# Patient Record
Sex: Male | Born: 1995 | Race: White | Hispanic: No | Marital: Single
Health system: Southern US, Community
[De-identification: ages and names within clinical notes are randomized; demographics above are authoritative.]

---
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
## 2014-02-19 ENCOUNTER — Ambulatory Visit
Admission: RE | Admit: 2014-02-19 | Discharge: 2014-02-19 | Disposition: A | Discharge status: Home / Self Care | Payer: BC Managed Care – PPO | Source: Ambulatory Visit | Attending: Family Medicine | Admitting: Family Medicine

## 2014-02-19 ENCOUNTER — Other Ambulatory Visit: Payer: Self-pay | Admitting: Family Medicine

## 2014-02-19 DIAGNOSIS — M25571 Pain in right ankle and joints of right foot: Secondary | ICD-10-CM

## 2015-09-09 IMAGING — CR DG ANKLE COMPLETE 3+V*R*
3 series · 3 of 3 positions shown · non-contrast
Comparison: None.

CLINICAL DATA: Twisted right ankle.  Basketball injury 1 week ago.

EXAM:
RIGHT ANKLE - COMPLETE 3+ VIEW

[view not recorded (1 of 3)]
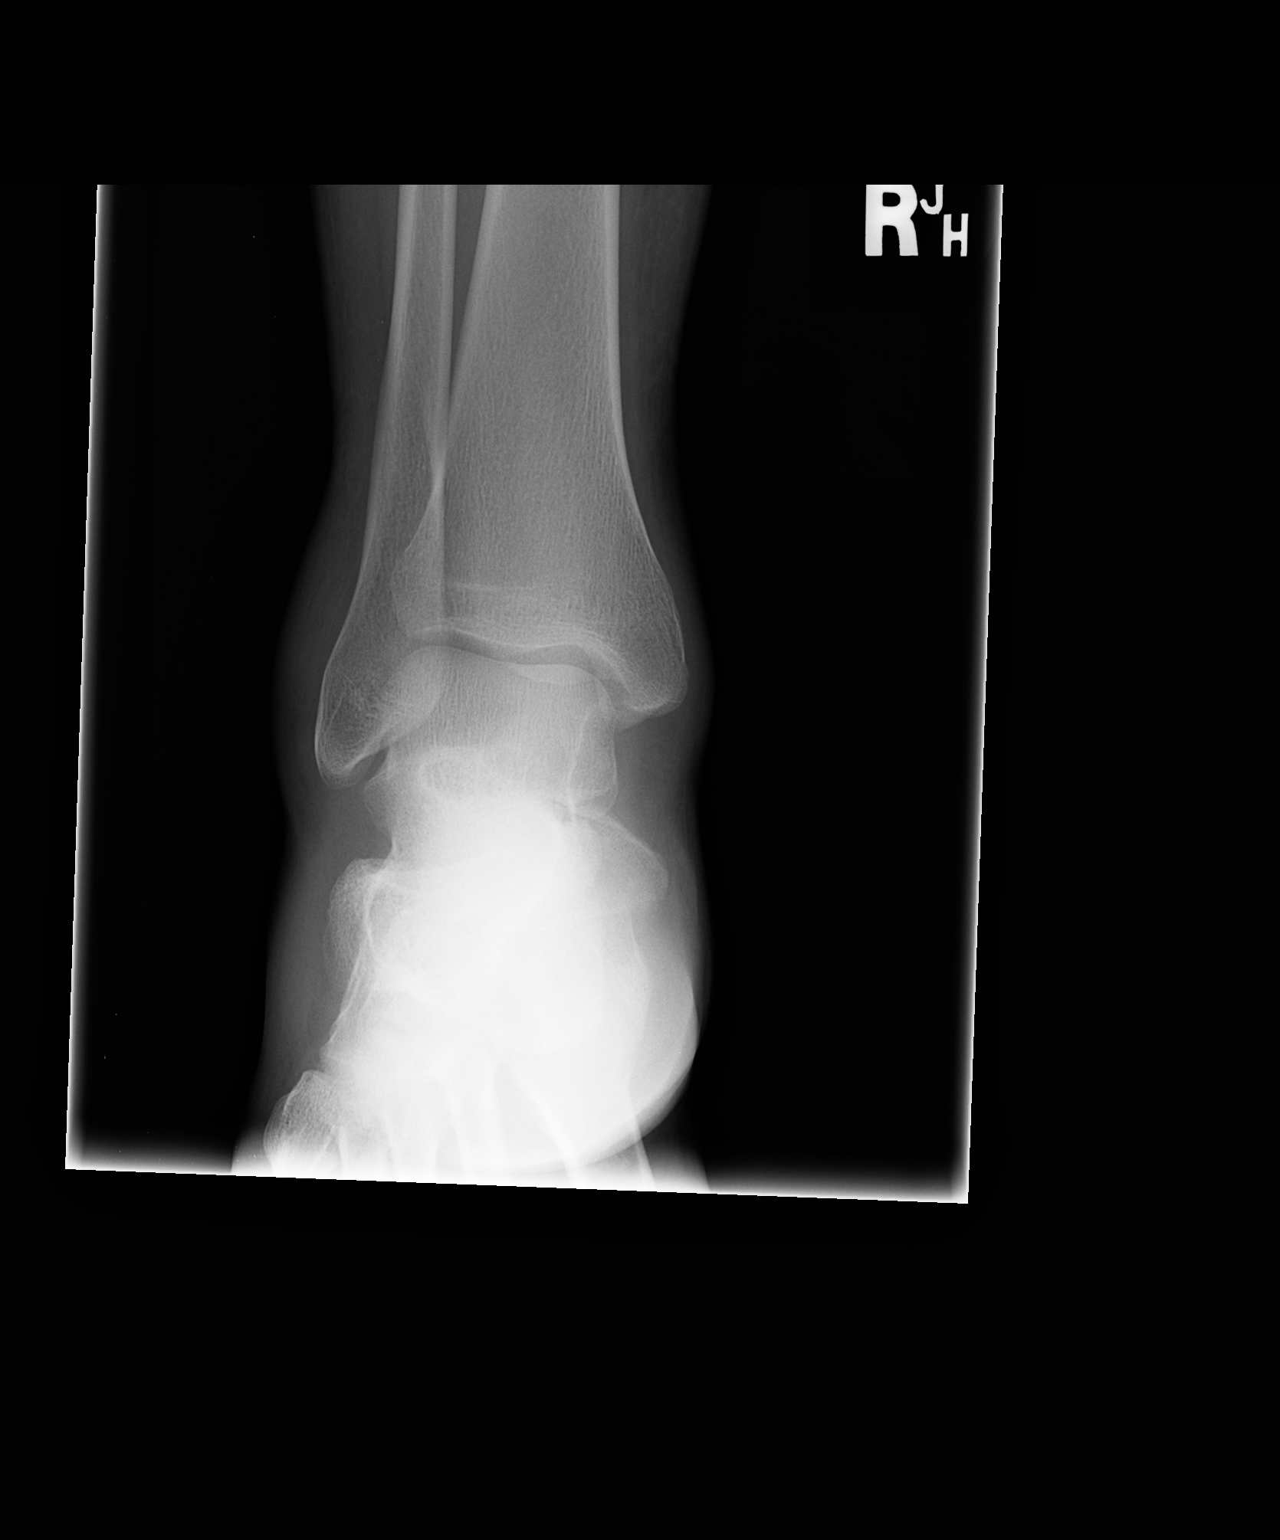

[view not recorded (2 of 3)]
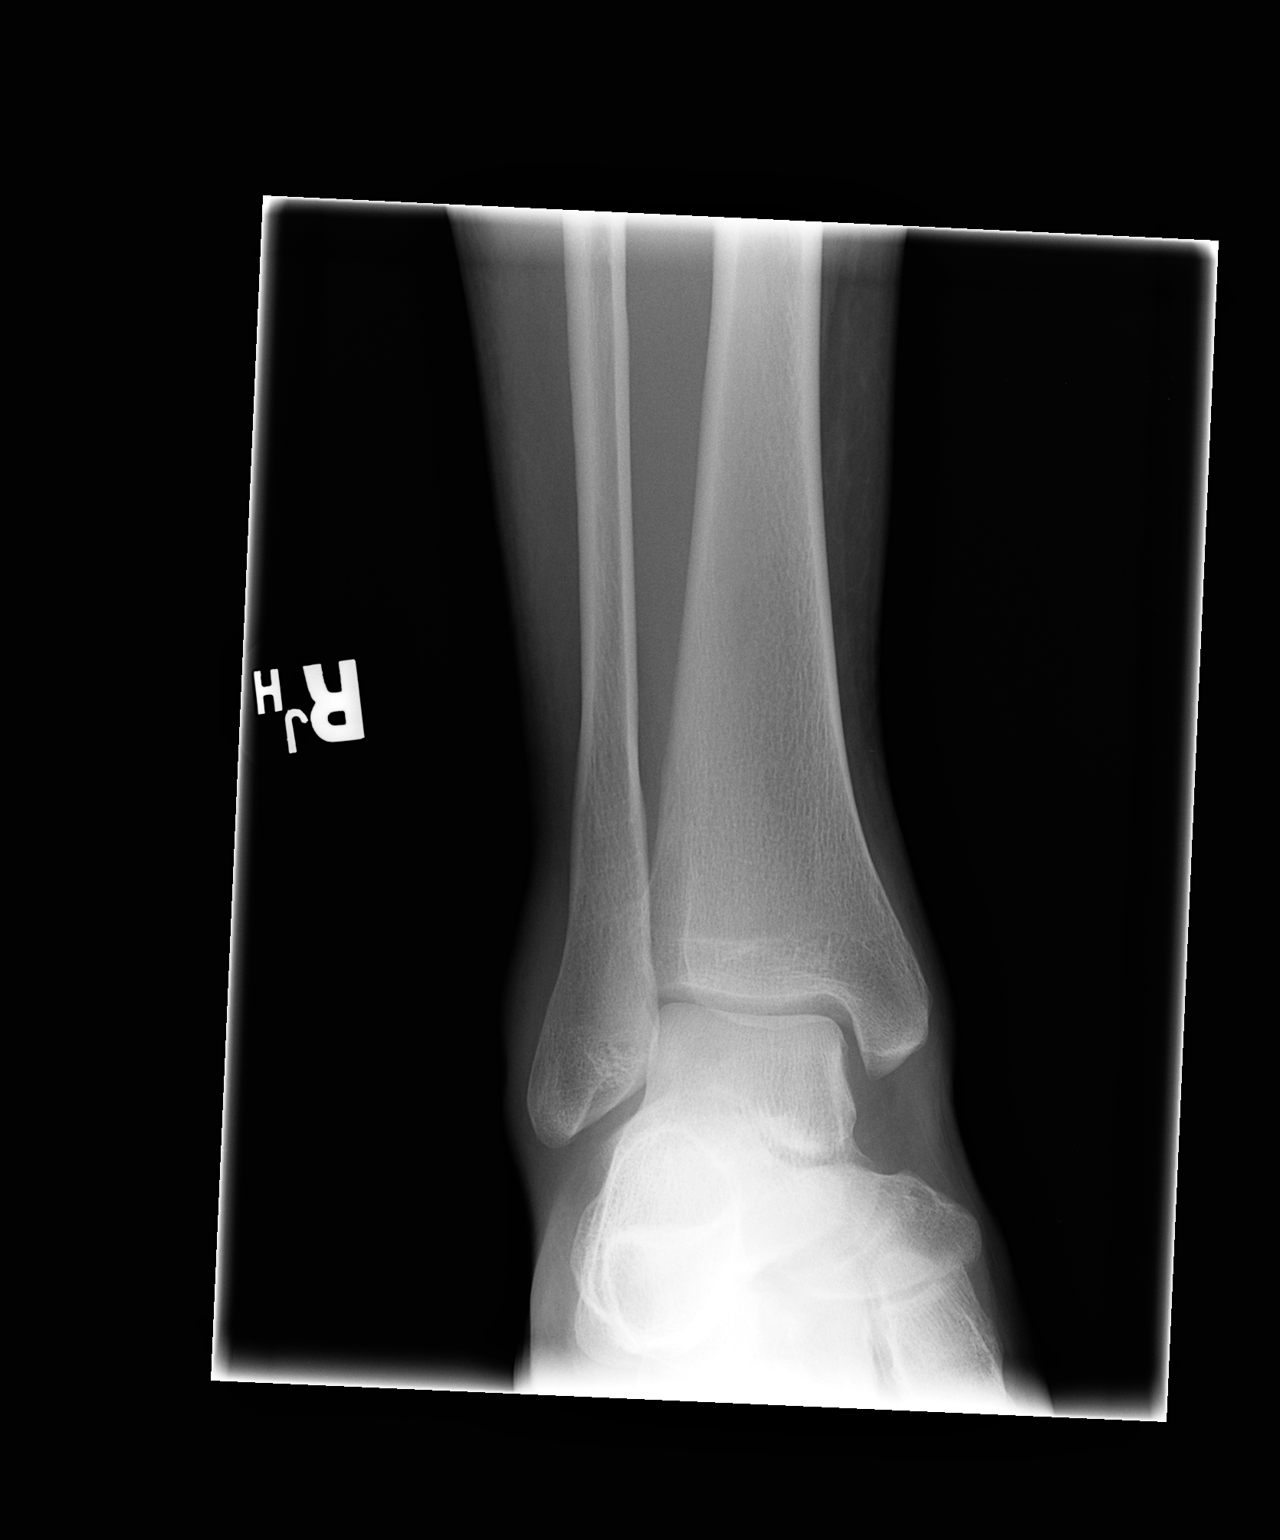

[view not recorded (3 of 3)]
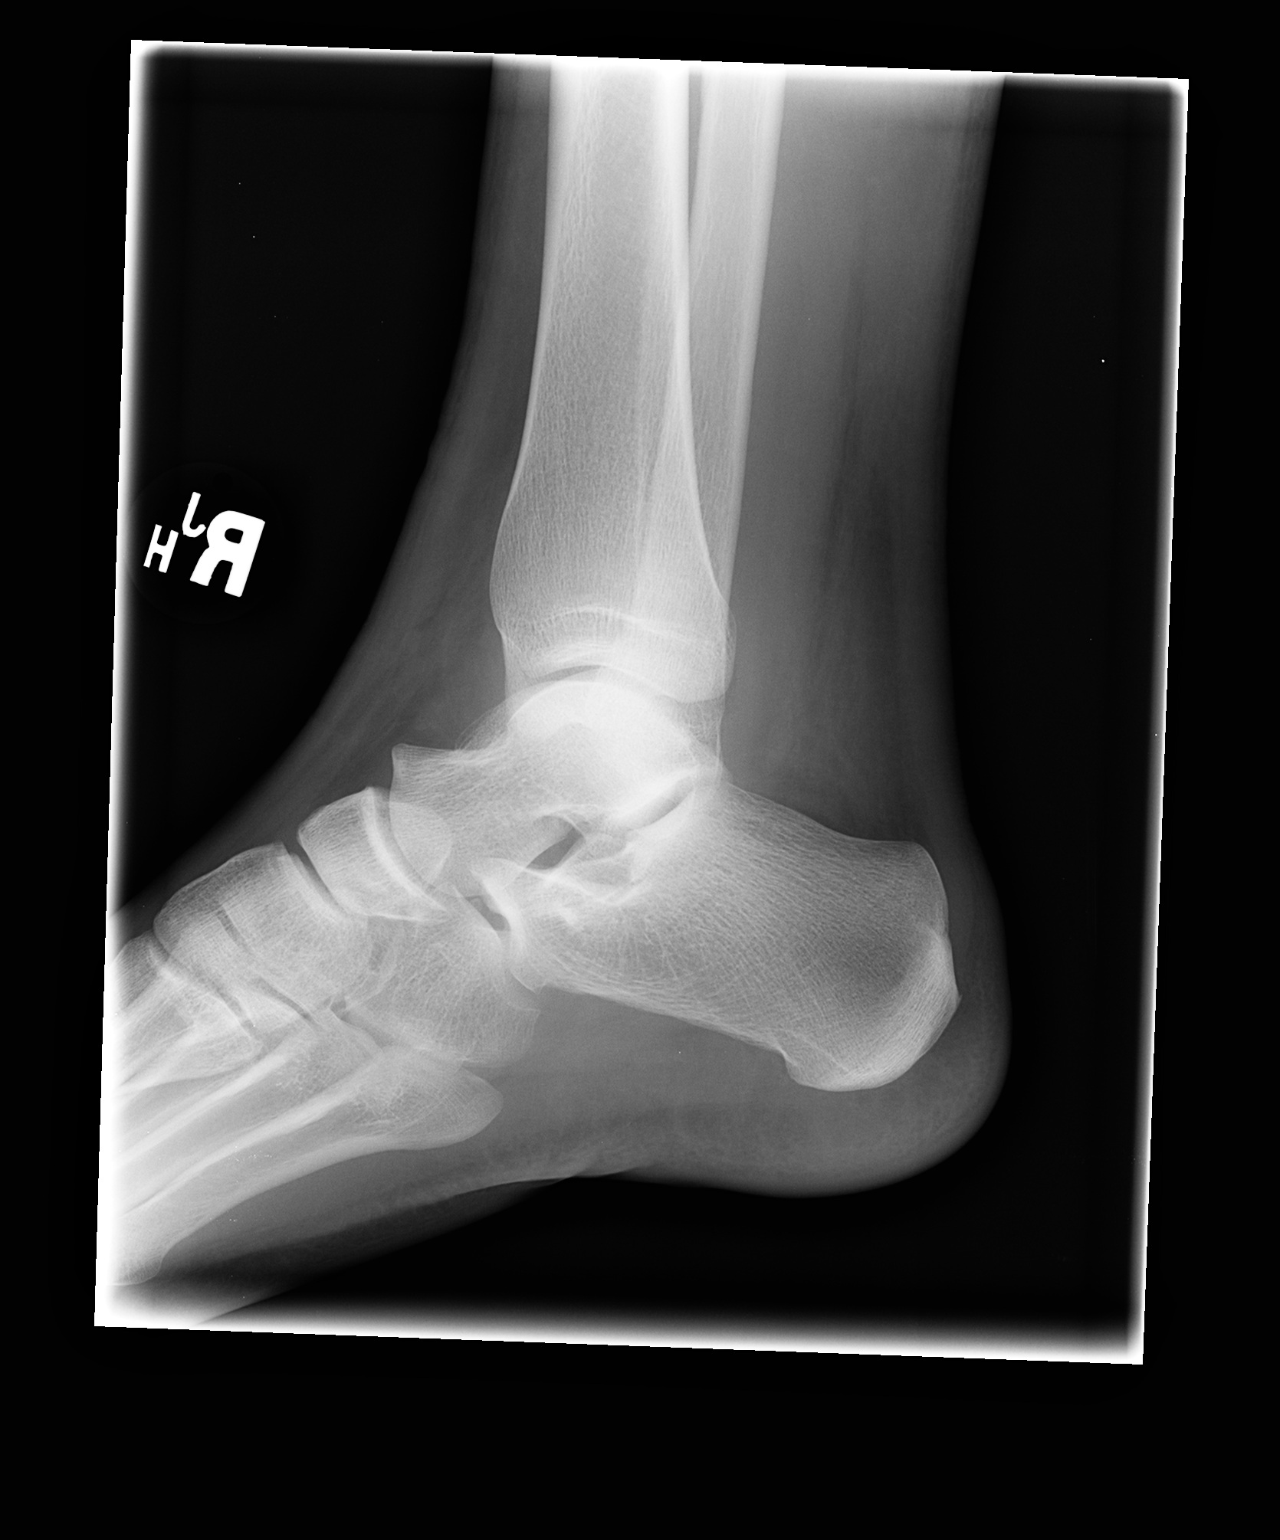

[3 of 3 positions shown; findings below may reference images not displayed]

FINDINGS: Diffuse soft tissue swelling present. No evidence of fracture or
dislocation. No acute bony abnormality.
IMPRESSION: Diffuse soft tissue swelling. No acute abnormality identified. No
evidence of fracture or dislocation.

## 2017-11-22 DIAGNOSIS — H1045 Other chronic allergic conjunctivitis: Secondary | ICD-10-CM | POA: Diagnosis not present

## 2018-08-22 DIAGNOSIS — J301 Allergic rhinitis due to pollen: Secondary | ICD-10-CM | POA: Diagnosis not present

## 2018-08-22 DIAGNOSIS — R319 Hematuria, unspecified: Secondary | ICD-10-CM | POA: Diagnosis not present

## 2018-08-28 DIAGNOSIS — R319 Hematuria, unspecified: Secondary | ICD-10-CM | POA: Diagnosis not present

## 2018-11-25 DIAGNOSIS — H1045 Other chronic allergic conjunctivitis: Secondary | ICD-10-CM | POA: Diagnosis not present

## 2019-09-15 DIAGNOSIS — Z0000 Encounter for general adult medical examination without abnormal findings: Secondary | ICD-10-CM | POA: Diagnosis not present

## 2019-12-29 DIAGNOSIS — H53143 Visual discomfort, bilateral: Secondary | ICD-10-CM | POA: Diagnosis not present

## 2019-12-29 DIAGNOSIS — H5213 Myopia, bilateral: Secondary | ICD-10-CM | POA: Diagnosis not present

## 2020-05-12 DIAGNOSIS — U071 COVID-19: Secondary | ICD-10-CM | POA: Diagnosis not present

## 2020-05-12 DIAGNOSIS — J029 Acute pharyngitis, unspecified: Secondary | ICD-10-CM | POA: Diagnosis not present

## 2020-05-12 DIAGNOSIS — R059 Cough, unspecified: Secondary | ICD-10-CM | POA: Diagnosis not present

## 2021-07-19 DIAGNOSIS — D224 Melanocytic nevi of scalp and neck: Secondary | ICD-10-CM | POA: Diagnosis not present

## 2021-07-19 DIAGNOSIS — D2261 Melanocytic nevi of right upper limb, including shoulder: Secondary | ICD-10-CM | POA: Diagnosis not present

## 2021-07-19 DIAGNOSIS — L308 Other specified dermatitis: Secondary | ICD-10-CM | POA: Diagnosis not present

## 2021-07-19 DIAGNOSIS — D225 Melanocytic nevi of trunk: Secondary | ICD-10-CM | POA: Diagnosis not present

## 2021-08-09 DIAGNOSIS — Z1159 Encounter for screening for other viral diseases: Secondary | ICD-10-CM | POA: Diagnosis not present

## 2021-08-09 DIAGNOSIS — Z0000 Encounter for general adult medical examination without abnormal findings: Secondary | ICD-10-CM | POA: Diagnosis not present

## 2021-08-09 DIAGNOSIS — Z13220 Encounter for screening for lipoid disorders: Secondary | ICD-10-CM | POA: Diagnosis not present

## 2022-01-11 DIAGNOSIS — L290 Pruritus ani: Secondary | ICD-10-CM | POA: Diagnosis not present

## 2022-08-31 DIAGNOSIS — Z0000 Encounter for general adult medical examination without abnormal findings: Secondary | ICD-10-CM | POA: Diagnosis not present

## 2022-08-31 DIAGNOSIS — H00015 Hordeolum externum left lower eyelid: Secondary | ICD-10-CM | POA: Diagnosis not present

## 2022-08-31 DIAGNOSIS — R17 Unspecified jaundice: Secondary | ICD-10-CM | POA: Diagnosis not present

## 2023-02-13 DIAGNOSIS — H53143 Visual discomfort, bilateral: Secondary | ICD-10-CM | POA: Diagnosis not present

## 2023-09-25 DIAGNOSIS — Z13220 Encounter for screening for lipoid disorders: Secondary | ICD-10-CM | POA: Diagnosis not present

## 2023-09-25 DIAGNOSIS — D229 Melanocytic nevi, unspecified: Secondary | ICD-10-CM | POA: Diagnosis not present

## 2023-09-25 DIAGNOSIS — Z0000 Encounter for general adult medical examination without abnormal findings: Secondary | ICD-10-CM | POA: Diagnosis not present

## 2023-09-25 DIAGNOSIS — Z0184 Encounter for antibody response examination: Secondary | ICD-10-CM | POA: Diagnosis not present

## 2023-09-25 DIAGNOSIS — Z23 Encounter for immunization: Secondary | ICD-10-CM | POA: Diagnosis not present
